# Patient Record
Sex: Male | Born: 1985 | Hispanic: Yes | Marital: Married
Health system: Southern US, Community
[De-identification: ages and names within clinical notes are randomized; demographics above are authoritative.]

---

## 2018-08-30 MED FILL — METHOCARBAMOL 500 MG TABS: 500 | 15 days supply | Qty: 90 | Fill #0

## 2018-08-30 MED FILL — AMLODIPINE BESYLATE 5 MG TA: 5 | 30 days supply | Qty: 30 | Fill #0

## 2018-09-20 MED FILL — DICLOFENAC SODIUM 1% GEL: 1 | 30 days supply | Qty: 100 | Fill #0

## 2018-09-20 MED FILL — CYCLOBENZAPRINE 5 MG TABLET: 5 | 20 days supply | Qty: 60 | Fill #0

## 2018-10-13 MED FILL — AMLODIPINE BESYLATE 5 MG TA: 5 | 30 days supply | Qty: 30 | Fill #1

## 2018-12-22 MED FILL — AMLODIPINE BESYLATE 5 MG TA: 5 | 30 days supply | Qty: 30 | Fill #2

## 2019-01-11 MED FILL — NAPROXEN 500 MG TABLET: 500 | 15 days supply | Qty: 30 | Fill #0

## 2019-02-19 MED FILL — AMLODIPINE BESYLATE 5 MG TA: 5 | 30 days supply | Qty: 30 | Fill #0

## 2019-02-19 MED FILL — LIDOCAINE 5 % OINT: 5 | 17 days supply | Qty: 35 | Fill #0

## 2019-04-02 MED FILL — AMLODIPINE BESYLATE 5 MG TA: 5 | 30 days supply | Qty: 30 | Fill #1

## 2019-05-08 MED FILL — HYDROCORTISONE 2.5% CREAM: 2.5 | 7 days supply | Qty: 30 | Fill #0

## 2019-05-29 MED FILL — IBUPROFEN 800 MG TABLET: 800 | 6 days supply | Qty: 20 | Fill #0

## 2019-06-06 MED FILL — AMLODIPINE BESYLATE 5 MG TA: 5 | 30 days supply | Qty: 30 | Fill #2

## 2019-06-06 MED FILL — CYCLOBENZAPRINE 5 MG TABLET: 5 | 20 days supply | Qty: 60 | Fill #1

## 2019-06-08 MED FILL — IBUPROFEN 800 MG TABLET: 800 | 10 days supply | Qty: 30 | Fill #0

## 2019-06-19 MED FILL — DICYCLOMINE 10 MG CAPSULE: 10 | 7 days supply | Qty: 30 | Fill #0

## 2019-10-19 MED FILL — AMLODIPINE BESYLATE 5 MG TA: 5 | 30 days supply | Qty: 30 | Fill #0

## 2019-10-19 MED FILL — NAPROXEN 500 MG TABLET: 500 | 30 days supply | Qty: 60 | Fill #0

## 2019-10-19 MED FILL — DICLOFENAC SODIUM 1% GEL: 1 | 12 days supply | Qty: 100 | Fill #0

## 2020-01-31 MED FILL — KETOCONAZOLE 2% CREAM: 2 | 14 days supply | Qty: 15 | Fill #0

## 2020-03-12 MED FILL — SUMATRIPTAN SUCC 50 MG TAB: 50 | 30 days supply | Qty: 9 | Fill #0

## 2020-03-12 MED FILL — AMLODIPINE BESYLATE 5 MG TA: 5 | 30 days supply | Qty: 30 | Fill #0

## 2020-04-24 MED FILL — predniSONE 10 MG TABS: 10 | 8 days supply | Qty: 20 | Fill #0

## 2020-07-04 MED FILL — ACETAMINOPHEN-COD #3 TABLET: 300-30 | 10 days supply | Qty: 30 | Fill #0

## 2020-07-04 MED FILL — AMLODIPINE BESYLATE 5 MG TA: 5 | 30 days supply | Qty: 30 | Fill #0

## 2022-02-24 IMAGING — DX DG LUMBAR SPINE COMPLETE 4+V
4 series · 4 of 4 positions shown · non-contrast
Comparison: CT abdomen and pelvis-01/22/2021

CLINICAL DATA: pain with percussion of vertebrae

EXAM:
LUMBAR SPINE - COMPLETE 4+ VIEW

[l-spine ap]
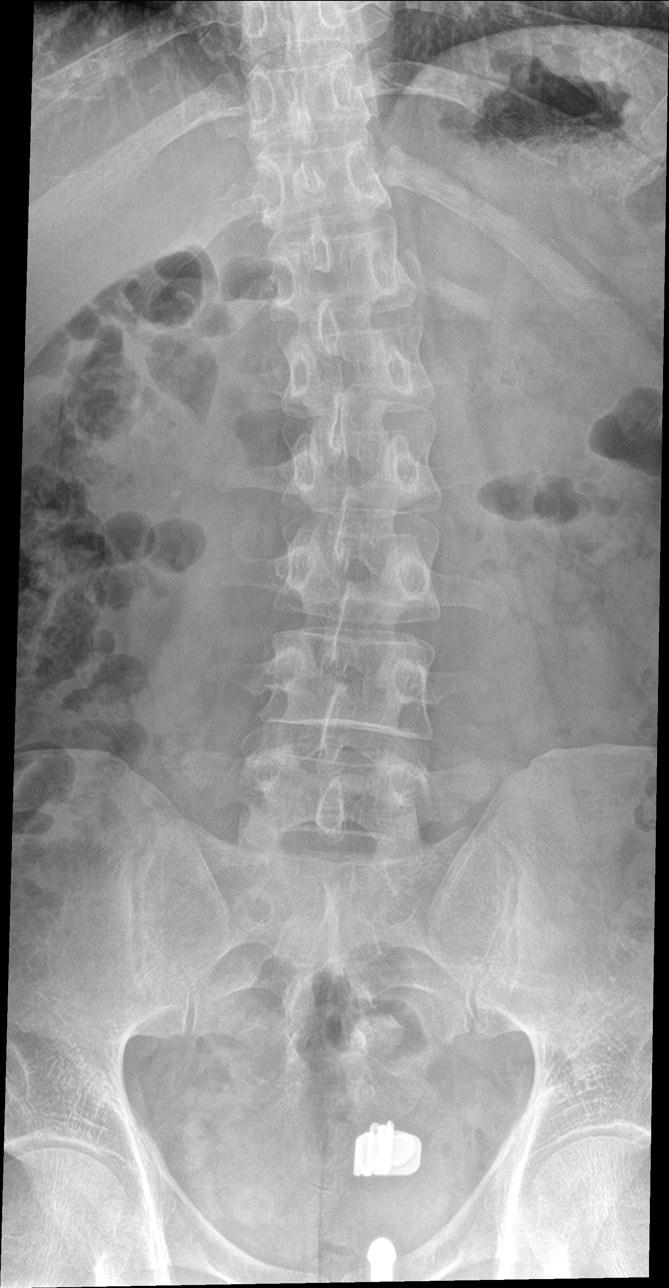

[l-spine obl (1 of 2)]
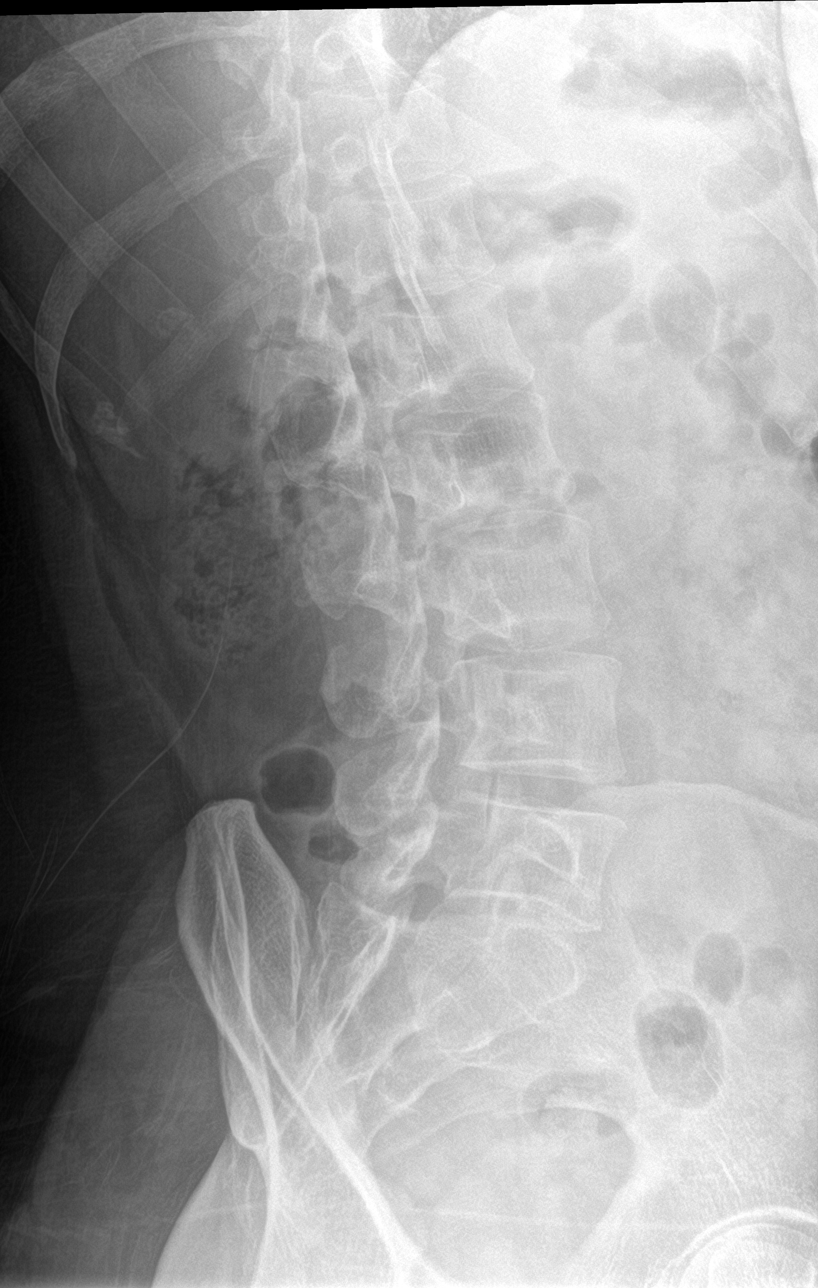

[l-spine obl (2 of 2)]
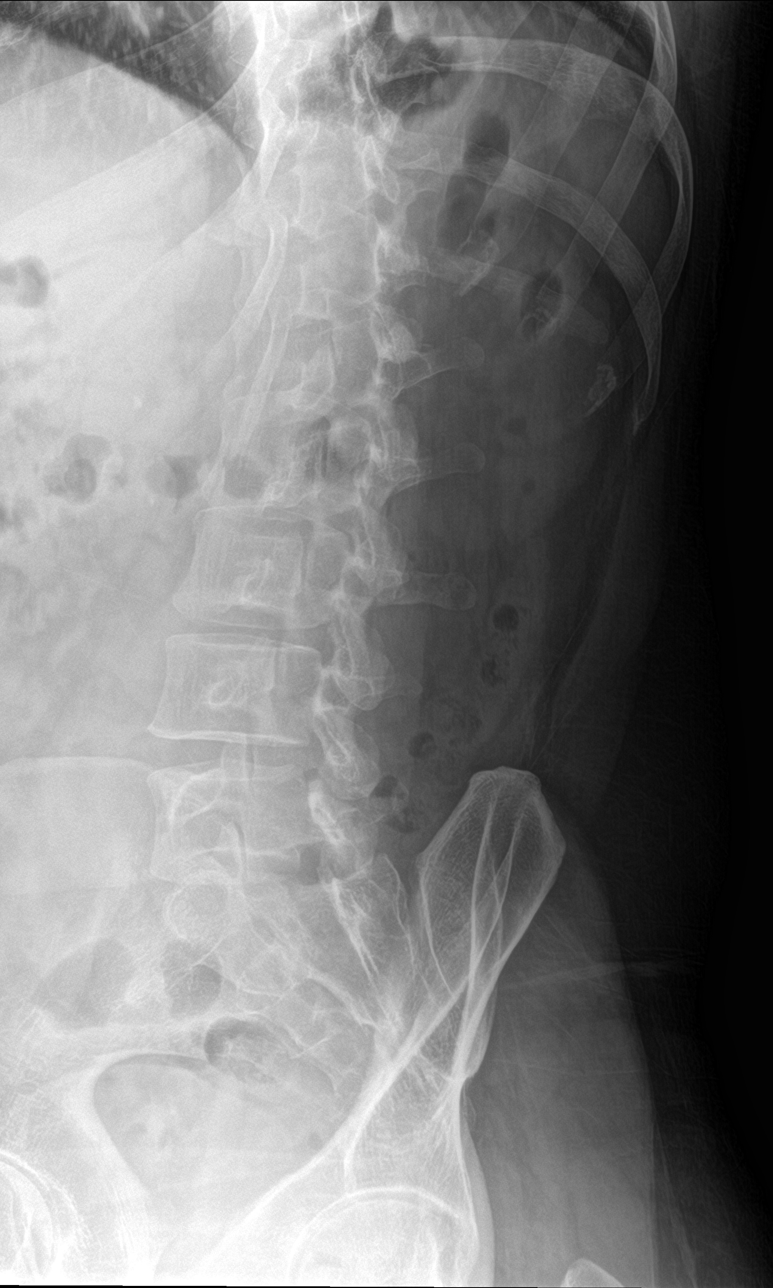

[l-spine lat]
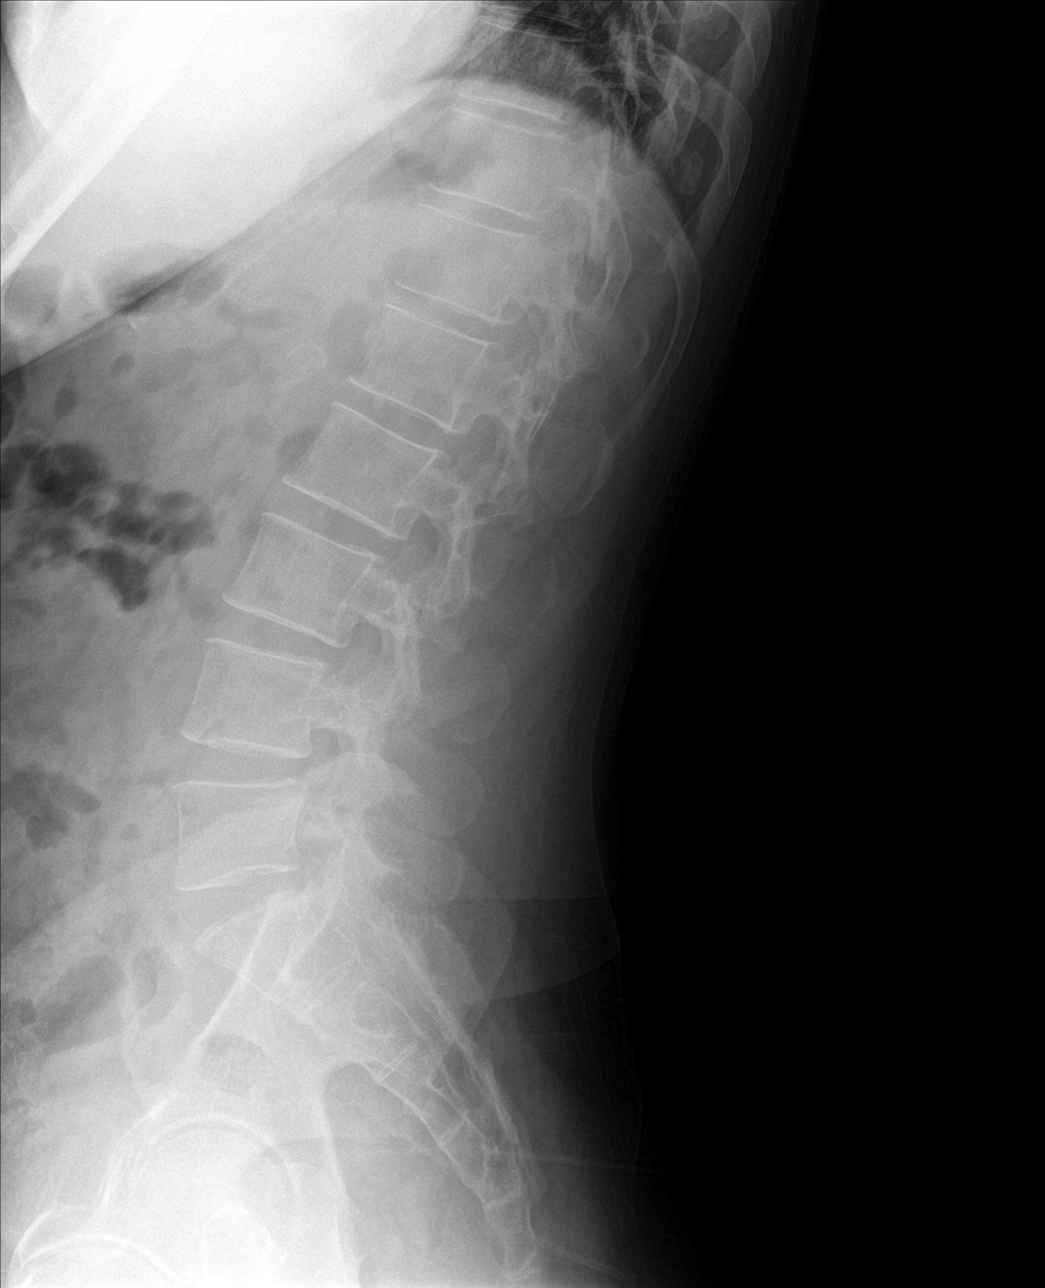

[4 of 4 positions shown; findings below may reference images not displayed]

FINDINGS: There are 5 non rib-bearing lumbar type vertebral bodies.

Mild scoliotic curvature of the thoracolumbar spine with dominant
caudal component convex to the left (as measured from the superior
endplate of T12 to the inferior endplate of L4). No anterolisthesis
or retrolisthesis. No definite pars defects.

Lumbar vertebral body heights appear preserved.

Lumbar intervertebral disc space heights appear preserved.

Limited visualization of the bilateral SI joints is normal.

There are 2 punctate (sub 4 mm) opacities overlies expected location
of the right renal fossa compatible with known nephrolithiasis
demonstrated on recent abdominal CT.
IMPRESSION: 1. No acute findings.
2. Right-sided nephrolithiasis as demonstrated on recent abdominal
CT.

## 2022-05-02 NOTE — ED Notes (Signed)
Incorrect chart created.
